# Patient Record
Sex: Female | Born: 1945
Health system: Southern US, Community
[De-identification: ages and names within clinical notes are randomized; demographics above are authoritative.]

## PROBLEM LIST (undated history)

## (undated) DIAGNOSIS — E119 Type 2 diabetes mellitus without complications: Secondary | ICD-10-CM

## (undated) DIAGNOSIS — I10 Essential (primary) hypertension: Secondary | ICD-10-CM

---

## 1997-12-10 ENCOUNTER — Ambulatory Visit (HOSPITAL_COMMUNITY): Admission: RE | Admit: 1997-12-10 | Discharge: 1997-12-10 | Payer: Self-pay | Admitting: Emergency Medicine

## 2000-01-21 ENCOUNTER — Encounter: Admission: RE | Admit: 2000-01-21 | Discharge: 2000-01-21 | Payer: Self-pay | Admitting: Emergency Medicine

## 2000-01-21 ENCOUNTER — Encounter: Payer: Self-pay | Admitting: Emergency Medicine

## 2001-01-22 ENCOUNTER — Encounter: Admission: RE | Admit: 2001-01-22 | Discharge: 2001-01-22 | Payer: Self-pay | Admitting: Emergency Medicine

## 2001-01-22 ENCOUNTER — Encounter: Payer: Self-pay | Admitting: Emergency Medicine

## 2002-01-23 ENCOUNTER — Encounter: Payer: Self-pay | Admitting: Emergency Medicine

## 2002-01-23 ENCOUNTER — Encounter: Admission: RE | Admit: 2002-01-23 | Discharge: 2002-01-23 | Payer: Self-pay | Admitting: Emergency Medicine

## 2003-01-30 ENCOUNTER — Encounter: Admission: RE | Admit: 2003-01-30 | Discharge: 2003-01-30 | Payer: Self-pay | Admitting: Emergency Medicine

## 2003-01-30 ENCOUNTER — Encounter: Payer: Self-pay | Admitting: Emergency Medicine

## 2003-07-15 ENCOUNTER — Encounter: Admission: RE | Admit: 2003-07-15 | Discharge: 2003-07-15 | Payer: Self-pay | Admitting: Emergency Medicine

## 2003-07-15 ENCOUNTER — Encounter: Payer: Self-pay | Admitting: Emergency Medicine

## 2003-12-08 ENCOUNTER — Other Ambulatory Visit: Admission: RE | Admit: 2003-12-08 | Discharge: 2003-12-08 | Payer: Self-pay | Admitting: Emergency Medicine

## 2004-02-03 ENCOUNTER — Encounter: Admission: RE | Admit: 2004-02-03 | Discharge: 2004-02-03 | Payer: Self-pay | Admitting: Emergency Medicine

## 2005-01-24 ENCOUNTER — Other Ambulatory Visit: Admission: RE | Admit: 2005-01-24 | Discharge: 2005-01-24 | Payer: Self-pay | Admitting: Emergency Medicine

## 2005-02-03 ENCOUNTER — Encounter: Admission: RE | Admit: 2005-02-03 | Discharge: 2005-02-03 | Payer: Self-pay | Admitting: Emergency Medicine

## 2005-02-07 ENCOUNTER — Encounter: Admission: RE | Admit: 2005-02-07 | Discharge: 2005-05-08 | Payer: Self-pay | Admitting: Emergency Medicine

## 2006-02-06 ENCOUNTER — Encounter: Admission: RE | Admit: 2006-02-06 | Discharge: 2006-02-06 | Payer: Self-pay | Admitting: Emergency Medicine

## 2007-02-19 ENCOUNTER — Other Ambulatory Visit: Admission: RE | Admit: 2007-02-19 | Discharge: 2007-02-19 | Payer: Self-pay | Admitting: Emergency Medicine

## 2007-03-13 ENCOUNTER — Encounter: Admission: RE | Admit: 2007-03-13 | Discharge: 2007-03-13 | Payer: Self-pay | Admitting: Emergency Medicine

## 2008-02-22 ENCOUNTER — Other Ambulatory Visit: Admission: RE | Admit: 2008-02-22 | Discharge: 2008-02-22 | Payer: Self-pay | Admitting: Emergency Medicine

## 2008-03-13 ENCOUNTER — Encounter: Admission: RE | Admit: 2008-03-13 | Discharge: 2008-03-13 | Payer: Self-pay | Admitting: Emergency Medicine

## 2009-03-16 ENCOUNTER — Encounter: Admission: RE | Admit: 2009-03-16 | Discharge: 2009-03-16 | Payer: Self-pay | Admitting: Emergency Medicine

## 2009-03-17 ENCOUNTER — Other Ambulatory Visit: Admission: RE | Admit: 2009-03-17 | Discharge: 2009-03-17 | Payer: Self-pay | Admitting: Emergency Medicine

## 2010-03-17 ENCOUNTER — Encounter: Admission: RE | Admit: 2010-03-17 | Discharge: 2010-03-17 | Payer: Self-pay | Admitting: Internal Medicine

## 2010-09-24 ENCOUNTER — Other Ambulatory Visit
Admission: RE | Admit: 2010-09-24 | Discharge: 2010-09-24 | Payer: Self-pay | Source: Home / Self Care | Admitting: Geriatric Medicine

## 2011-03-15 ENCOUNTER — Other Ambulatory Visit: Payer: Self-pay | Admitting: Internal Medicine

## 2011-03-15 DIAGNOSIS — Z1231 Encounter for screening mammogram for malignant neoplasm of breast: Secondary | ICD-10-CM

## 2011-03-22 ENCOUNTER — Ambulatory Visit
Admission: RE | Admit: 2011-03-22 | Discharge: 2011-03-22 | Disposition: A | Discharge status: Home / Self Care | Payer: BC Managed Care – PPO | Source: Ambulatory Visit | Attending: Internal Medicine | Admitting: Internal Medicine

## 2011-03-22 DIAGNOSIS — Z1231 Encounter for screening mammogram for malignant neoplasm of breast: Secondary | ICD-10-CM

## 2011-10-04 ENCOUNTER — Other Ambulatory Visit (HOSPITAL_COMMUNITY)
Admission: RE | Admit: 2011-10-04 | Discharge: 2011-10-04 | Disposition: A | Discharge status: Home / Self Care | Payer: BC Managed Care – PPO | Source: Ambulatory Visit | Attending: Internal Medicine | Admitting: Internal Medicine

## 2011-10-04 ENCOUNTER — Other Ambulatory Visit: Payer: Self-pay | Admitting: Internal Medicine

## 2011-10-04 DIAGNOSIS — Z01419 Encounter for gynecological examination (general) (routine) without abnormal findings: Secondary | ICD-10-CM | POA: Insufficient documentation

## 2012-02-28 ENCOUNTER — Other Ambulatory Visit: Payer: Self-pay | Admitting: Internal Medicine

## 2012-02-28 DIAGNOSIS — Z1231 Encounter for screening mammogram for malignant neoplasm of breast: Secondary | ICD-10-CM

## 2012-03-23 ENCOUNTER — Ambulatory Visit
Admission: RE | Admit: 2012-03-23 | Discharge: 2012-03-23 | Disposition: A | Discharge status: Home / Self Care | Payer: BC Managed Care – PPO | Source: Ambulatory Visit | Attending: Internal Medicine | Admitting: Internal Medicine

## 2012-03-23 DIAGNOSIS — Z1231 Encounter for screening mammogram for malignant neoplasm of breast: Secondary | ICD-10-CM

## 2012-10-09 ENCOUNTER — Other Ambulatory Visit: Payer: Self-pay | Admitting: Internal Medicine

## 2012-10-09 ENCOUNTER — Other Ambulatory Visit (HOSPITAL_COMMUNITY)
Admission: RE | Admit: 2012-10-09 | Discharge: 2012-10-09 | Disposition: A | Discharge status: Home / Self Care | Payer: BC Managed Care – PPO | Source: Ambulatory Visit | Attending: Internal Medicine | Admitting: Internal Medicine

## 2012-10-09 DIAGNOSIS — Z01419 Encounter for gynecological examination (general) (routine) without abnormal findings: Secondary | ICD-10-CM | POA: Insufficient documentation

## 2013-03-29 ENCOUNTER — Other Ambulatory Visit: Payer: Self-pay

## 2013-03-29 DIAGNOSIS — Z1231 Encounter for screening mammogram for malignant neoplasm of breast: Secondary | ICD-10-CM

## 2013-05-01 ENCOUNTER — Ambulatory Visit
Admission: RE | Admit: 2013-05-01 | Discharge: 2013-05-01 | Disposition: A | Discharge status: Home / Self Care | Payer: Medicare Other | Source: Ambulatory Visit

## 2013-05-01 DIAGNOSIS — Z1231 Encounter for screening mammogram for malignant neoplasm of breast: Secondary | ICD-10-CM

## 2014-04-01 ENCOUNTER — Other Ambulatory Visit: Payer: Self-pay

## 2014-04-01 DIAGNOSIS — Z1231 Encounter for screening mammogram for malignant neoplasm of breast: Secondary | ICD-10-CM

## 2014-05-02 ENCOUNTER — Ambulatory Visit
Admission: RE | Admit: 2014-05-02 | Discharge: 2014-05-02 | Disposition: A | Discharge status: Home / Self Care | Payer: Commercial Managed Care - HMO | Source: Ambulatory Visit

## 2014-05-02 DIAGNOSIS — Z1231 Encounter for screening mammogram for malignant neoplasm of breast: Secondary | ICD-10-CM

## 2014-10-30 DIAGNOSIS — I10 Essential (primary) hypertension: Secondary | ICD-10-CM | POA: Diagnosis not present

## 2014-10-30 DIAGNOSIS — Z1389 Encounter for screening for other disorder: Secondary | ICD-10-CM | POA: Diagnosis not present

## 2014-10-30 DIAGNOSIS — E559 Vitamin D deficiency, unspecified: Secondary | ICD-10-CM | POA: Diagnosis not present

## 2014-10-30 DIAGNOSIS — E139 Other specified diabetes mellitus without complications: Secondary | ICD-10-CM | POA: Diagnosis not present

## 2014-10-30 DIAGNOSIS — N760 Acute vaginitis: Secondary | ICD-10-CM | POA: Diagnosis not present

## 2014-10-30 DIAGNOSIS — F329 Major depressive disorder, single episode, unspecified: Secondary | ICD-10-CM | POA: Diagnosis not present

## 2014-10-30 DIAGNOSIS — E780 Pure hypercholesterolemia: Secondary | ICD-10-CM | POA: Diagnosis not present

## 2014-10-30 DIAGNOSIS — Z0000 Encounter for general adult medical examination without abnormal findings: Secondary | ICD-10-CM | POA: Diagnosis not present

## 2014-11-05 DIAGNOSIS — N390 Urinary tract infection, site not specified: Secondary | ICD-10-CM | POA: Diagnosis not present

## 2014-11-05 DIAGNOSIS — R300 Dysuria: Secondary | ICD-10-CM | POA: Diagnosis not present

## 2014-11-12 DIAGNOSIS — Z0000 Encounter for general adult medical examination without abnormal findings: Secondary | ICD-10-CM | POA: Diagnosis not present

## 2014-11-28 DIAGNOSIS — K625 Hemorrhage of anus and rectum: Secondary | ICD-10-CM | POA: Diagnosis not present

## 2015-04-13 ENCOUNTER — Other Ambulatory Visit: Payer: Self-pay

## 2015-04-13 DIAGNOSIS — Z1231 Encounter for screening mammogram for malignant neoplasm of breast: Secondary | ICD-10-CM

## 2015-04-30 DIAGNOSIS — Z6834 Body mass index (BMI) 34.0-34.9, adult: Secondary | ICD-10-CM | POA: Diagnosis not present

## 2015-04-30 DIAGNOSIS — E139 Other specified diabetes mellitus without complications: Secondary | ICD-10-CM | POA: Diagnosis not present

## 2015-04-30 DIAGNOSIS — I10 Essential (primary) hypertension: Secondary | ICD-10-CM | POA: Diagnosis not present

## 2015-04-30 DIAGNOSIS — E663 Overweight: Secondary | ICD-10-CM | POA: Diagnosis not present

## 2015-04-30 DIAGNOSIS — F329 Major depressive disorder, single episode, unspecified: Secondary | ICD-10-CM | POA: Diagnosis not present

## 2015-04-30 DIAGNOSIS — E780 Pure hypercholesterolemia: Secondary | ICD-10-CM | POA: Diagnosis not present

## 2015-05-04 ENCOUNTER — Ambulatory Visit
Admission: RE | Admit: 2015-05-04 | Discharge: 2015-05-04 | Disposition: A | Discharge status: Home / Self Care | Payer: Commercial Managed Care - HMO | Source: Ambulatory Visit

## 2015-05-04 DIAGNOSIS — Z1231 Encounter for screening mammogram for malignant neoplasm of breast: Secondary | ICD-10-CM | POA: Diagnosis not present

## 2015-05-25 DIAGNOSIS — H5200 Hypermetropia, unspecified eye: Secondary | ICD-10-CM | POA: Diagnosis not present

## 2015-05-25 DIAGNOSIS — E784 Other hyperlipidemia: Secondary | ICD-10-CM | POA: Diagnosis not present

## 2015-05-25 DIAGNOSIS — I10 Essential (primary) hypertension: Secondary | ICD-10-CM | POA: Diagnosis not present

## 2015-05-25 DIAGNOSIS — E109 Type 1 diabetes mellitus without complications: Secondary | ICD-10-CM | POA: Diagnosis not present

## 2015-05-25 DIAGNOSIS — E119 Type 2 diabetes mellitus without complications: Secondary | ICD-10-CM | POA: Diagnosis not present

## 2015-05-25 DIAGNOSIS — H5210 Myopia, unspecified eye: Secondary | ICD-10-CM | POA: Diagnosis not present

## 2015-11-19 DIAGNOSIS — E785 Hyperlipidemia, unspecified: Secondary | ICD-10-CM | POA: Diagnosis not present

## 2015-11-19 DIAGNOSIS — Z1389 Encounter for screening for other disorder: Secondary | ICD-10-CM | POA: Diagnosis not present

## 2015-11-19 DIAGNOSIS — E119 Type 2 diabetes mellitus without complications: Secondary | ICD-10-CM | POA: Diagnosis not present

## 2015-11-19 DIAGNOSIS — Z7984 Long term (current) use of oral hypoglycemic drugs: Secondary | ICD-10-CM | POA: Diagnosis not present

## 2015-11-19 DIAGNOSIS — Z0000 Encounter for general adult medical examination without abnormal findings: Secondary | ICD-10-CM | POA: Diagnosis not present

## 2015-11-19 DIAGNOSIS — E663 Overweight: Secondary | ICD-10-CM | POA: Diagnosis not present

## 2015-11-19 DIAGNOSIS — F329 Major depressive disorder, single episode, unspecified: Secondary | ICD-10-CM | POA: Diagnosis not present

## 2015-11-19 DIAGNOSIS — Z6833 Body mass index (BMI) 33.0-33.9, adult: Secondary | ICD-10-CM | POA: Diagnosis not present

## 2015-11-19 DIAGNOSIS — I10 Essential (primary) hypertension: Secondary | ICD-10-CM | POA: Diagnosis not present

## 2016-05-12 ENCOUNTER — Other Ambulatory Visit: Payer: Self-pay | Admitting: Internal Medicine

## 2016-05-12 DIAGNOSIS — Z1231 Encounter for screening mammogram for malignant neoplasm of breast: Secondary | ICD-10-CM

## 2016-05-18 ENCOUNTER — Ambulatory Visit
Admission: RE | Admit: 2016-05-18 | Discharge: 2016-05-18 | Disposition: A | Discharge status: Home / Self Care | Payer: Commercial Managed Care - HMO | Source: Ambulatory Visit | Attending: Internal Medicine | Admitting: Internal Medicine

## 2016-05-18 DIAGNOSIS — I10 Essential (primary) hypertension: Secondary | ICD-10-CM | POA: Diagnosis not present

## 2016-05-18 DIAGNOSIS — Z1231 Encounter for screening mammogram for malignant neoplasm of breast: Secondary | ICD-10-CM

## 2016-05-18 DIAGNOSIS — E139 Other specified diabetes mellitus without complications: Secondary | ICD-10-CM | POA: Diagnosis not present

## 2016-05-18 DIAGNOSIS — Z6834 Body mass index (BMI) 34.0-34.9, adult: Secondary | ICD-10-CM | POA: Diagnosis not present

## 2016-05-18 DIAGNOSIS — E663 Overweight: Secondary | ICD-10-CM | POA: Diagnosis not present

## 2016-05-18 DIAGNOSIS — E7800 Pure hypercholesterolemia, unspecified: Secondary | ICD-10-CM | POA: Diagnosis not present

## 2016-05-19 ENCOUNTER — Ambulatory Visit: Payer: Commercial Managed Care - HMO

## 2016-05-25 DIAGNOSIS — E109 Type 1 diabetes mellitus without complications: Secondary | ICD-10-CM | POA: Diagnosis not present

## 2016-05-25 DIAGNOSIS — E7800 Pure hypercholesterolemia, unspecified: Secondary | ICD-10-CM | POA: Diagnosis not present

## 2016-05-25 DIAGNOSIS — I10 Essential (primary) hypertension: Secondary | ICD-10-CM | POA: Diagnosis not present

## 2016-05-25 DIAGNOSIS — Z0100 Encounter for examination of eyes and vision without abnormal findings: Secondary | ICD-10-CM | POA: Diagnosis not present

## 2016-12-01 ENCOUNTER — Other Ambulatory Visit (HOSPITAL_COMMUNITY)
Admission: RE | Admit: 2016-12-01 | Discharge: 2016-12-01 | Disposition: A | Discharge status: Home / Self Care | Payer: Medicare HMO | Source: Ambulatory Visit | Attending: Internal Medicine | Admitting: Internal Medicine

## 2016-12-01 ENCOUNTER — Other Ambulatory Visit: Payer: Self-pay | Admitting: Internal Medicine

## 2016-12-01 DIAGNOSIS — E139 Other specified diabetes mellitus without complications: Secondary | ICD-10-CM | POA: Diagnosis not present

## 2016-12-01 DIAGNOSIS — I10 Essential (primary) hypertension: Secondary | ICD-10-CM | POA: Diagnosis not present

## 2016-12-01 DIAGNOSIS — Z1151 Encounter for screening for human papillomavirus (HPV): Secondary | ICD-10-CM | POA: Insufficient documentation

## 2016-12-01 DIAGNOSIS — Z124 Encounter for screening for malignant neoplasm of cervix: Secondary | ICD-10-CM | POA: Insufficient documentation

## 2016-12-01 DIAGNOSIS — F324 Major depressive disorder, single episode, in partial remission: Secondary | ICD-10-CM | POA: Diagnosis not present

## 2016-12-01 DIAGNOSIS — Z6832 Body mass index (BMI) 32.0-32.9, adult: Secondary | ICD-10-CM | POA: Diagnosis not present

## 2016-12-01 DIAGNOSIS — E7800 Pure hypercholesterolemia, unspecified: Secondary | ICD-10-CM | POA: Diagnosis not present

## 2016-12-01 DIAGNOSIS — Z1389 Encounter for screening for other disorder: Secondary | ICD-10-CM | POA: Diagnosis not present

## 2016-12-01 DIAGNOSIS — Z0000 Encounter for general adult medical examination without abnormal findings: Secondary | ICD-10-CM | POA: Diagnosis not present

## 2016-12-01 DIAGNOSIS — E663 Overweight: Secondary | ICD-10-CM | POA: Diagnosis not present

## 2016-12-01 DIAGNOSIS — N9089 Other specified noninflammatory disorders of vulva and perineum: Secondary | ICD-10-CM | POA: Diagnosis not present

## 2016-12-06 LAB — CYTOLOGY - PAP
Diagnosis: NEGATIVE
HPV (WINDOPATH): NOT DETECTED

## 2017-01-06 DIAGNOSIS — N7689 Other specified inflammation of vagina and vulva: Secondary | ICD-10-CM | POA: Diagnosis not present

## 2017-01-06 DIAGNOSIS — Z7982 Long term (current) use of aspirin: Secondary | ICD-10-CM | POA: Diagnosis not present

## 2017-02-03 ENCOUNTER — Other Ambulatory Visit: Payer: Self-pay | Admitting: Obstetrics & Gynecology

## 2017-02-03 DIAGNOSIS — N7689 Other specified inflammation of vagina and vulva: Secondary | ICD-10-CM | POA: Diagnosis not present

## 2017-02-03 DIAGNOSIS — L900 Lichen sclerosus et atrophicus: Secondary | ICD-10-CM | POA: Diagnosis not present

## 2017-02-03 DIAGNOSIS — N904 Leukoplakia of vulva: Secondary | ICD-10-CM | POA: Diagnosis not present

## 2017-03-29 DIAGNOSIS — K5730 Diverticulosis of large intestine without perforation or abscess without bleeding: Secondary | ICD-10-CM | POA: Diagnosis not present

## 2017-03-29 DIAGNOSIS — Z1211 Encounter for screening for malignant neoplasm of colon: Secondary | ICD-10-CM | POA: Diagnosis not present

## 2017-04-13 ENCOUNTER — Other Ambulatory Visit: Payer: Self-pay | Admitting: Internal Medicine

## 2017-04-13 DIAGNOSIS — Z1231 Encounter for screening mammogram for malignant neoplasm of breast: Secondary | ICD-10-CM

## 2017-05-04 DIAGNOSIS — L900 Lichen sclerosus et atrophicus: Secondary | ICD-10-CM | POA: Diagnosis not present

## 2017-05-29 ENCOUNTER — Ambulatory Visit
Admission: RE | Admit: 2017-05-29 | Discharge: 2017-05-29 | Disposition: A | Discharge status: Home / Self Care | Payer: Medicare HMO | Source: Ambulatory Visit | Attending: Internal Medicine | Admitting: Internal Medicine

## 2017-05-29 DIAGNOSIS — Z1231 Encounter for screening mammogram for malignant neoplasm of breast: Secondary | ICD-10-CM

## 2017-05-31 DIAGNOSIS — I10 Essential (primary) hypertension: Secondary | ICD-10-CM | POA: Diagnosis not present

## 2017-05-31 DIAGNOSIS — E119 Type 2 diabetes mellitus without complications: Secondary | ICD-10-CM | POA: Diagnosis not present

## 2017-05-31 DIAGNOSIS — H35033 Hypertensive retinopathy, bilateral: Secondary | ICD-10-CM | POA: Diagnosis not present

## 2017-05-31 DIAGNOSIS — Z7984 Long term (current) use of oral hypoglycemic drugs: Secondary | ICD-10-CM | POA: Diagnosis not present

## 2017-06-01 DIAGNOSIS — Z6833 Body mass index (BMI) 33.0-33.9, adult: Secondary | ICD-10-CM | POA: Diagnosis not present

## 2017-06-01 DIAGNOSIS — E663 Overweight: Secondary | ICD-10-CM | POA: Diagnosis not present

## 2017-06-01 DIAGNOSIS — E119 Type 2 diabetes mellitus without complications: Secondary | ICD-10-CM | POA: Diagnosis not present

## 2017-06-01 DIAGNOSIS — E7800 Pure hypercholesterolemia, unspecified: Secondary | ICD-10-CM | POA: Diagnosis not present

## 2017-06-01 DIAGNOSIS — F324 Major depressive disorder, single episode, in partial remission: Secondary | ICD-10-CM | POA: Diagnosis not present

## 2017-06-01 DIAGNOSIS — I10 Essential (primary) hypertension: Secondary | ICD-10-CM | POA: Diagnosis not present

## 2017-08-16 DIAGNOSIS — Z23 Encounter for immunization: Secondary | ICD-10-CM | POA: Diagnosis not present

## 2017-12-12 DIAGNOSIS — Z7984 Long term (current) use of oral hypoglycemic drugs: Secondary | ICD-10-CM | POA: Diagnosis not present

## 2017-12-12 DIAGNOSIS — F324 Major depressive disorder, single episode, in partial remission: Secondary | ICD-10-CM | POA: Diagnosis not present

## 2017-12-12 DIAGNOSIS — E119 Type 2 diabetes mellitus without complications: Secondary | ICD-10-CM | POA: Diagnosis not present

## 2017-12-12 DIAGNOSIS — E7800 Pure hypercholesterolemia, unspecified: Secondary | ICD-10-CM | POA: Diagnosis not present

## 2017-12-12 DIAGNOSIS — E663 Overweight: Secondary | ICD-10-CM | POA: Diagnosis not present

## 2017-12-12 DIAGNOSIS — Z1389 Encounter for screening for other disorder: Secondary | ICD-10-CM | POA: Diagnosis not present

## 2017-12-12 DIAGNOSIS — I10 Essential (primary) hypertension: Secondary | ICD-10-CM | POA: Diagnosis not present

## 2017-12-12 DIAGNOSIS — Z0000 Encounter for general adult medical examination without abnormal findings: Secondary | ICD-10-CM | POA: Diagnosis not present

## 2018-01-31 ENCOUNTER — Other Ambulatory Visit: Payer: Self-pay

## 2018-01-31 ENCOUNTER — Emergency Department
Admission: EM | Admit: 2018-01-31 | Discharge: 2018-02-01 | Disposition: A | Discharge status: Home / Self Care | Payer: Medicare HMO | Attending: Emergency Medicine | Admitting: Emergency Medicine

## 2018-01-31 ENCOUNTER — Encounter: Payer: Self-pay | Admitting: Emergency Medicine

## 2018-01-31 DIAGNOSIS — R0789 Other chest pain: Secondary | ICD-10-CM | POA: Diagnosis not present

## 2018-01-31 DIAGNOSIS — R791 Abnormal coagulation profile: Secondary | ICD-10-CM | POA: Diagnosis not present

## 2018-01-31 DIAGNOSIS — I10 Essential (primary) hypertension: Secondary | ICD-10-CM | POA: Insufficient documentation

## 2018-01-31 DIAGNOSIS — M549 Dorsalgia, unspecified: Secondary | ICD-10-CM | POA: Diagnosis not present

## 2018-01-31 DIAGNOSIS — R079 Chest pain, unspecified: Secondary | ICD-10-CM | POA: Diagnosis not present

## 2018-01-31 DIAGNOSIS — R1010 Upper abdominal pain, unspecified: Secondary | ICD-10-CM | POA: Insufficient documentation

## 2018-01-31 DIAGNOSIS — F419 Anxiety disorder, unspecified: Secondary | ICD-10-CM | POA: Insufficient documentation

## 2018-01-31 DIAGNOSIS — E119 Type 2 diabetes mellitus without complications: Secondary | ICD-10-CM | POA: Insufficient documentation

## 2018-01-31 HISTORY — DX: Essential (primary) hypertension: I10

## 2018-01-31 HISTORY — DX: Type 2 diabetes mellitus without complications: E11.9

## 2018-01-31 NOTE — ED Provider Notes (Signed)
[facility] Emergency Department Provider Note  ____________________________________________   First MD Initiated Contact with Patient 01/31/18 2353     (approximate)  I have reviewed the triage vital signs and the nursing notes.   HISTORY  Chief Complaint Abnormal Lab    HPI Jill Gentry is a 72 y.o. female who sent to the emergency department by her primary care physician for an abnormal d-dimer.  Patient has had sharp upper chest pain wrapping around towards her back for the past 24-48 hours.  She initially thought it could be gas and tried over-the-counter medications with no improvement.  She has no history of DVT or pulmonary embolism.  No leg swelling.  No recent surgery travel or immobilization.  She denies history of coronary artery disease.  She does not see a cardiologist.  Her symptoms are now mild to moderate.  Nothing seems to make them better or worse.  Past Medical History:  Diagnosis Date  . Diabetes mellitus without complication (HCC)   . Hypertension     There are no active problems to display for this patient.   History reviewed. No pertinent surgical history.  Prior to Admission medications   Not on File    Allergies Patient has no known allergies.  No family history on file.  Social History Social History   Tobacco Use  . Smoking status: Never Smoker  Substance Use Topics  . Alcohol use: Not on file  . Drug use: Not on file    Review of Systems Constitutional: No fever/chills Eyes: No visual changes. ENT: No sore throat. Cardiovascular: Positive for chest pain. Respiratory: Denies shortness of breath. Gastrointestinal: No abdominal pain.  No nausea, no vomiting.  No diarrhea.  No constipation. Genitourinary: Negative for dysuria. Musculoskeletal: Negative for back pain. Skin: Negative for rash. Neurological: Negative for headaches, focal weakness or  numbness.   ____________________________________________   PHYSICAL EXAM:  VITAL SIGNS: ED Triage Vitals [01/31/18 2343]  Enc Vitals Group     BP      Pulse      Resp      Temp      Temp src      SpO2      Weight 180 lb (81.6 kg)     Height 5\' 1"  (1.549 m)     Head Circumference      Peak Flow      Pain Score 5     Pain Loc      Pain Edu?      Excl. in GC?     Constitutional: Alert and oriented x4 somewhat anxious appearing nontoxic no diaphoresis speaks full clear sentences Eyes: PERRL EOMI. Head: Atraumatic. Nose: No congestion/rhinnorhea. Mouth/Throat: No trismus Neck: No stridor.   Cardiovascular: Normal rate, regular rhythm. Grossly normal heart sounds.  Good peripheral circulation. Respiratory: Normal respiratory effort.  No retractions. Lungs CTAB and moving good air Gastrointestinal: Soft nontender Musculoskeletal: No lower extremity edema legs are equal in size Neurologic:  Normal speech and language. No gross focal neurologic deficits are appreciated. Skin:  Skin is warm, dry and intact. No rash noted. Psychiatric: Mood and affect are normal. Speech and behavior are normal.    ____________________________________________   DIFFERENTIAL includes but not limited to  Pancreatitis, gastritis, acute coronary syndrome, pulmonary embolism, pleurisy ____________________________________________   LABS (all labs ordered are listed, but only abnormal results are displayed)  Labs Reviewed  COMPREHENSIVE METABOLIC PANEL - Abnormal; Notable for the following components:  Result Value   Glucose, Bld 181 (*)    ALT 11 (*)    All other components within normal limits  CBC WITH DIFFERENTIAL/PLATELET - Abnormal; Notable for the following components:   Hemoglobin 10.7 (*)    HCT 32.7 (*)    RDW 15.2 (*)    All other components within normal limits  TROPONIN I  BRAIN NATRIURETIC PEPTIDE  LIPASE, BLOOD    Lab work reviewed by me with no acute  disease __________________________________________  EKG  ED ECG REPORT I, Merrily Brittle, the attending physician, personally viewed and interpreted this ECG.  Date: 01/31/2018 EKG Time:  Rate: 76 Rhythm: normal sinus rhythm QRS Axis: normal Intervals: normal ST/T Wave abnormalities: T wave inversion V2 V3 V4 Narrative Interpretation: No ST elevation but possible subendocardial ischemia  ____________________________________________  RADIOLOGY  CT angiogram reviewed by me with no blood clot ____________________________________________   PROCEDURES  Procedure(s) performed: no  Procedures  Critical Care performed: no  Observation: no ____________________________________________   INITIAL IMPRESSION / ASSESSMENT AND PLAN / ED COURSE  Pertinent labs & imaging results that were available during my care of the patient were reviewed by me and considered in my medical decision making (see chart for details).  The patient arrives hemodynamically stable and well-appearing.  EKG with no signs of right heart strain and she has no risk factors for DVT or pulmonary embolism.  The elevated d-dimer with no clear etiology of her chest pain is concerning however since CT angiogram obtained today which fortunately is negative.  Given reassurance and primary care follow-up.  She verbalizes understanding and agreement the plan.      ____________________________________________   FINAL CLINICAL IMPRESSION(S) / ED DIAGNOSES  Final diagnoses:  Atypical chest pain      NEW MEDICATIONS STARTED DURING THIS VISIT:  There are no discharge medications for this patient.    Note:  This document was prepared using Dragon voice recognition software and may include unintentional dictation errors.     Merrily Brittle, MD 02/01/18 [id number]

## 2018-01-31 NOTE — ED Triage Notes (Addendum)
Patient ambulatory to triage with steady gait, without difficulty or distress noted; pt reports seen by PCP today for upper abd pain radiating around back since yesterday; was called PTA to notify her that her d-dimer was elevated; denies any accomp symptoms; ibuprofen taken at 6pm with some relief

## 2018-02-01 ENCOUNTER — Emergency Department: Payer: Medicare HMO

## 2018-02-01 DIAGNOSIS — R079 Chest pain, unspecified: Secondary | ICD-10-CM | POA: Diagnosis not present

## 2018-02-01 LAB — COMPREHENSIVE METABOLIC PANEL
ALBUMIN: 3.7 g/dL (ref 3.5–5.0)
ALT: 11 U/L — ABNORMAL LOW (ref 14–54)
AST: 19 U/L (ref 15–41)
Alkaline Phosphatase: 84 U/L (ref 38–126)
Anion gap: 5 (ref 5–15)
BILIRUBIN TOTAL: 0.5 mg/dL (ref 0.3–1.2)
BUN: 15 mg/dL (ref 6–20)
CHLORIDE: 105 mmol/L (ref 101–111)
CO2: 26 mmol/L (ref 22–32)
Calcium: 9 mg/dL (ref 8.9–10.3)
Creatinine, Ser: 0.87 mg/dL (ref 0.44–1.00)
GFR calc Af Amer: >60 mL/min (ref >60)
GFR calc non Af Amer: >60 mL/min (ref >60)
GLUCOSE: 181 mg/dL — AB (ref 65–99)
POTASSIUM: 3.6 mmol/L (ref 3.5–5.1)
Sodium: 136 mmol/L (ref 135–145)
Total Protein: 6.8 g/dL (ref 6.5–8.1)

## 2018-02-01 LAB — CBC WITH DIFFERENTIAL/PLATELET
Basophils Absolute: 0.1 10*3/uL (ref 0–0.1)
Basophils Relative: 1 %
EOS PCT: 3 %
Eosinophils Absolute: 0.2 10*3/uL (ref 0–0.7)
HCT: 32.7 % — ABNORMAL LOW (ref 35.0–47.0)
Hemoglobin: 10.7 g/dL — ABNORMAL LOW (ref 12.0–16.0)
LYMPHS ABS: 2.2 10*3/uL (ref 1.0–3.6)
LYMPHS PCT: 30 %
MCH: 27.4 pg (ref 26.0–34.0)
MCHC: 32.8 g/dL (ref 32.0–36.0)
MCV: 83.7 fL (ref 80.0–100.0)
MONO ABS: 0.7 10*3/uL (ref 0.2–0.9)
MONOS PCT: 9 %
Neutro Abs: 4.2 10*3/uL (ref 1.4–6.5)
Neutrophils Relative %: 57 %
PLATELETS: 234 10*3/uL (ref 150–440)
RBC: 3.91 MIL/uL (ref 3.80–5.20)
RDW: 15.2 % — AB (ref 11.5–14.5)
WBC: 7.4 10*3/uL (ref 3.6–11.0)

## 2018-02-01 LAB — BRAIN NATRIURETIC PEPTIDE: B Natriuretic Peptide: 21 pg/mL (ref 0.0–100.0)

## 2018-02-01 LAB — TROPONIN I: Troponin I: <0.03 ng/mL (ref <0.03)

## 2018-02-01 LAB — LIPASE, BLOOD: LIPASE: 33 U/L (ref 11–51)

## 2018-02-01 MED ORDER — IOHEXOL 350 MG/ML SOLN
75.0000 mL | Freq: Once | INTRAVENOUS | Status: AC | PRN
  Administered 2018-02-01: 75 mL via INTRAVENOUS

## 2018-02-01 NOTE — ED Notes (Signed)
Sent over from PCP for elevated d-dimer and has complaints of upper abd pain wrapping around to the back that started Tuesday

## 2018-02-01 NOTE — Discharge Instructions (Signed)
Fortunately today your blood work and your CT scan were reassuring.  Please follow-up with your primary care physician within 2 days for reevaluation return to the emergency department sooner for any concerns.  It was a pleasure to take care of you today, and thank you for coming to our emergency department.  If you have any questions or concerns before leaving please ask the nurse to grab me and I'm more than happy to go through your aftercare instructions again.  If you were prescribed any opioid pain medication today such as Norco, Vicodin, Percocet, morphine, hydrocodone, or oxycodone please make sure you do not drive when you are taking this medication as it can alter your ability to drive safely.  If you have any concerns once you are home that you are not improving or are in fact getting worse before you can make it to your follow-up appointment, please do not hesitate to call 911 and come back for further evaluation.  Merrily Brittle, MD  Results for orders placed or performed during the hospital encounter of 01/31/18  Comprehensive metabolic panel  Result Value Ref Range   Sodium 136 135 - 145 mmol/L   Potassium 3.6 3.5 - 5.1 mmol/L   Chloride 105 101 - 111 mmol/L   CO2 26 22 - 32 mmol/L   Glucose, Bld 181 (H) 65 - 99 mg/dL   BUN 15 6 - 20 mg/dL   Creatinine, Ser 0.45 0.44 - 1.00 mg/dL   Calcium 9.0 8.9 - 40.9 mg/dL   Total Protein 6.8 6.5 - 8.1 g/dL   Albumin 3.7 3.5 - 5.0 g/dL   AST 19 15 - 41 U/L   ALT 11 (L) 14 - 54 U/L   Alkaline Phosphatase 84 38 - 126 U/L   Total Bilirubin 0.5 0.3 - 1.2 mg/dL   GFR calc non Af Amer >60 >60 mL/min   GFR calc Af Amer >60 >60 mL/min   Anion gap 5 5 - 15  CBC with Differential  Result Value Ref Range   WBC 7.4 3.6 - 11.0 K/uL   RBC 3.91 3.80 - 5.20 MIL/uL   Hemoglobin 10.7 (L) 12.0 - 16.0 g/dL   HCT 81.1 (L) 91.4 - 78.2 %   MCV 83.7 80.0 - 100.0 fL   MCH 27.4 26.0 - 34.0 pg   MCHC 32.8 32.0 - 36.0 g/dL   RDW 95.6 (H) 21.3 - 08.6 %   Platelets 234 150 - 440 K/uL   Neutrophils Relative % 57 %   Neutro Abs 4.2 1.4 - 6.5 K/uL   Lymphocytes Relative 30 %   Lymphs Abs 2.2 1.0 - 3.6 K/uL   Monocytes Relative 9 %   Monocytes Absolute 0.7 0.2 - 0.9 K/uL   Eosinophils Relative 3 %   Eosinophils Absolute 0.2 0 - 0.7 K/uL   Basophils Relative 1 %   Basophils Absolute 0.1 0 - 0.1 K/uL  Troponin I  Result Value Ref Range   Troponin I <0.03 <0.03 ng/mL  Brain natriuretic peptide  Result Value Ref Range   B Natriuretic Peptide 21.0 0.0 - 100.0 pg/mL  Lipase, blood  Result Value Ref Range   Lipase 33 11 - 51 U/L   Ct Angio Chest Pe W/cm &/or Wo Cm  Result Date: 02/01/2018 CLINICAL DATA:  Chest pain with inspiration. EXAM: CT ANGIOGRAPHY CHEST WITH CONTRAST TECHNIQUE: Multidetector CT imaging of the chest was performed using the standard protocol during bolus administration of intravenous contrast. Multiplanar CT image reconstructions and MIPs  were obtained to evaluate the vascular anatomy. CONTRAST:  75mL OMNIPAQUE IOHEXOL 350 MG/ML SOLN COMPARISON:  Radiograph earlier this day. FINDINGS: Cardiovascular: There are no filling defects within the pulmonary arteries to suggest pulmonary embolus. Thoracic aorta is normal in caliber with mild atherosclerosis. No dissection. Mild cardiomegaly. No pericardial effusion. Mediastinum/Nodes: No enlarged mediastinal or hilar lymph nodes. The esophagus is decompressed. No visualized thyroid nodule. Lungs/Pleura: No consolidation, pulmonary edema or pleural effusion. Mild atelectasis in the lung bases. Trachea and mainstem bronchi are patent. No pulmonary mass. Upper Abdomen: No acute finding. Musculoskeletal: There are no acute or suspicious osseous abnormalities. Degenerative change in the spine. Review of the MIP images confirms the above findings. IMPRESSION: 1. No pulmonary embolus or acute intrathoracic abnormality. 2. Mild cardiomegaly and Aortic Atherosclerosis (ICD10-I70.0). Electronically  Signed   By: Rubye OaksMelanie  Ehinger M.D.   On: 02/01/2018 01:54   Dg Chest Port 1 View  Result Date: 02/01/2018 CLINICAL DATA:  Chest pain. EXAM: PORTABLE CHEST 1 VIEW COMPARISON:  None. FINDINGS: Heart size upper normal.The cardiomediastinal contours are normal. Pulmonary vasculature is normal. No consolidation, pleural effusion, or pneumothorax. No acute osseous abnormalities are seen. IMPRESSION: Upper normal heart size without acute abnormality. Electronically Signed   By: Rubye OaksMelanie  Ehinger M.D.   On: 02/01/2018 00:56

## 2018-04-20 ENCOUNTER — Other Ambulatory Visit: Payer: Self-pay | Admitting: Internal Medicine

## 2018-04-20 DIAGNOSIS — Z1231 Encounter for screening mammogram for malignant neoplasm of breast: Secondary | ICD-10-CM

## 2018-05-31 ENCOUNTER — Ambulatory Visit
Admission: RE | Admit: 2018-05-31 | Discharge: 2018-05-31 | Disposition: A | Discharge status: Home / Self Care | Payer: Medicare HMO | Source: Ambulatory Visit | Attending: Internal Medicine | Admitting: Internal Medicine

## 2018-05-31 DIAGNOSIS — Z1231 Encounter for screening mammogram for malignant neoplasm of breast: Secondary | ICD-10-CM | POA: Diagnosis not present

## 2018-06-12 DIAGNOSIS — F324 Major depressive disorder, single episode, in partial remission: Secondary | ICD-10-CM | POA: Diagnosis not present

## 2018-06-12 DIAGNOSIS — E7800 Pure hypercholesterolemia, unspecified: Secondary | ICD-10-CM | POA: Diagnosis not present

## 2018-06-12 DIAGNOSIS — I700 Atherosclerosis of aorta: Secondary | ICD-10-CM | POA: Diagnosis not present

## 2018-06-12 DIAGNOSIS — I10 Essential (primary) hypertension: Secondary | ICD-10-CM | POA: Diagnosis not present

## 2018-06-12 DIAGNOSIS — E1169 Type 2 diabetes mellitus with other specified complication: Secondary | ICD-10-CM | POA: Diagnosis not present

## 2018-06-12 DIAGNOSIS — D649 Anemia, unspecified: Secondary | ICD-10-CM | POA: Diagnosis not present

## 2018-06-12 DIAGNOSIS — Z7984 Long term (current) use of oral hypoglycemic drugs: Secondary | ICD-10-CM | POA: Diagnosis not present

## 2018-06-14 DIAGNOSIS — Z01419 Encounter for gynecological examination (general) (routine) without abnormal findings: Secondary | ICD-10-CM | POA: Diagnosis not present

## 2018-06-14 DIAGNOSIS — L900 Lichen sclerosus et atrophicus: Secondary | ICD-10-CM | POA: Diagnosis not present

## 2018-08-02 DIAGNOSIS — R300 Dysuria: Secondary | ICD-10-CM | POA: Diagnosis not present

## 2018-11-05 DIAGNOSIS — Z23 Encounter for immunization: Secondary | ICD-10-CM | POA: Diagnosis not present

## 2019-01-14 DIAGNOSIS — Z79899 Other long term (current) drug therapy: Secondary | ICD-10-CM | POA: Diagnosis not present

## 2019-01-14 DIAGNOSIS — I10 Essential (primary) hypertension: Secondary | ICD-10-CM | POA: Diagnosis not present

## 2019-01-14 DIAGNOSIS — E2839 Other primary ovarian failure: Secondary | ICD-10-CM | POA: Diagnosis not present

## 2019-01-14 DIAGNOSIS — E1169 Type 2 diabetes mellitus with other specified complication: Secondary | ICD-10-CM | POA: Diagnosis not present

## 2019-01-14 DIAGNOSIS — Z0000 Encounter for general adult medical examination without abnormal findings: Secondary | ICD-10-CM | POA: Diagnosis not present

## 2019-01-14 DIAGNOSIS — F324 Major depressive disorder, single episode, in partial remission: Secondary | ICD-10-CM | POA: Diagnosis not present

## 2019-01-14 DIAGNOSIS — E663 Overweight: Secondary | ICD-10-CM | POA: Diagnosis not present

## 2019-01-14 DIAGNOSIS — Z1389 Encounter for screening for other disorder: Secondary | ICD-10-CM | POA: Diagnosis not present

## 2019-01-14 DIAGNOSIS — E7800 Pure hypercholesterolemia, unspecified: Secondary | ICD-10-CM | POA: Diagnosis not present

## 2019-02-05 ENCOUNTER — Other Ambulatory Visit: Payer: Self-pay | Admitting: Internal Medicine

## 2019-02-05 DIAGNOSIS — E2839 Other primary ovarian failure: Secondary | ICD-10-CM

## 2019-04-29 ENCOUNTER — Other Ambulatory Visit: Payer: Self-pay | Admitting: Internal Medicine

## 2019-04-29 DIAGNOSIS — Z1231 Encounter for screening mammogram for malignant neoplasm of breast: Secondary | ICD-10-CM

## 2019-05-08 ENCOUNTER — Other Ambulatory Visit: Payer: Self-pay

## 2019-05-08 ENCOUNTER — Ambulatory Visit
Admission: RE | Admit: 2019-05-08 | Discharge: 2019-05-08 | Disposition: A | Discharge status: Home / Self Care | Payer: Medicare HMO | Source: Ambulatory Visit | Attending: Internal Medicine | Admitting: Internal Medicine

## 2019-05-08 DIAGNOSIS — Z780 Asymptomatic menopausal state: Secondary | ICD-10-CM | POA: Diagnosis not present

## 2019-05-08 DIAGNOSIS — M85851 Other specified disorders of bone density and structure, right thigh: Secondary | ICD-10-CM | POA: Diagnosis not present

## 2019-05-08 DIAGNOSIS — E2839 Other primary ovarian failure: Secondary | ICD-10-CM

## 2019-06-12 ENCOUNTER — Other Ambulatory Visit: Payer: Self-pay

## 2019-06-12 ENCOUNTER — Ambulatory Visit
Admission: RE | Admit: 2019-06-12 | Discharge: 2019-06-12 | Disposition: A | Discharge status: Home / Self Care | Payer: Medicare HMO | Source: Ambulatory Visit | Attending: Internal Medicine | Admitting: Internal Medicine

## 2019-06-12 DIAGNOSIS — Z1231 Encounter for screening mammogram for malignant neoplasm of breast: Secondary | ICD-10-CM

## 2019-06-25 DIAGNOSIS — L900 Lichen sclerosus et atrophicus: Secondary | ICD-10-CM | POA: Diagnosis not present

## 2019-07-18 DIAGNOSIS — I10 Essential (primary) hypertension: Secondary | ICD-10-CM | POA: Diagnosis not present

## 2019-07-18 DIAGNOSIS — E663 Overweight: Secondary | ICD-10-CM | POA: Diagnosis not present

## 2019-07-18 DIAGNOSIS — E1169 Type 2 diabetes mellitus with other specified complication: Secondary | ICD-10-CM | POA: Diagnosis not present

## 2019-07-18 DIAGNOSIS — E7800 Pure hypercholesterolemia, unspecified: Secondary | ICD-10-CM | POA: Diagnosis not present

## 2019-07-18 DIAGNOSIS — F324 Major depressive disorder, single episode, in partial remission: Secondary | ICD-10-CM | POA: Diagnosis not present

## 2019-07-18 DIAGNOSIS — E119 Type 2 diabetes mellitus without complications: Secondary | ICD-10-CM | POA: Diagnosis not present

## 2019-07-25 DIAGNOSIS — E109 Type 1 diabetes mellitus without complications: Secondary | ICD-10-CM | POA: Diagnosis not present

## 2019-07-25 DIAGNOSIS — Z135 Encounter for screening for eye and ear disorders: Secondary | ICD-10-CM | POA: Diagnosis not present

## 2019-07-25 DIAGNOSIS — Z0100 Encounter for examination of eyes and vision without abnormal findings: Secondary | ICD-10-CM | POA: Diagnosis not present

## 2019-09-26 ENCOUNTER — Other Ambulatory Visit: Payer: Self-pay

## 2019-09-26 DIAGNOSIS — Z20822 Contact with and (suspected) exposure to covid-19: Secondary | ICD-10-CM

## 2019-09-28 LAB — NOVEL CORONAVIRUS, NAA: SARS-CoV-2, NAA: DETECTED — AB

## 2019-09-29 ENCOUNTER — Encounter: Payer: Self-pay | Admitting: Nurse Practitioner

## 2019-09-29 DIAGNOSIS — E119 Type 2 diabetes mellitus without complications: Secondary | ICD-10-CM | POA: Insufficient documentation

## 2019-09-29 DIAGNOSIS — I10 Essential (primary) hypertension: Secondary | ICD-10-CM | POA: Insufficient documentation

## 2020-02-03 DIAGNOSIS — Z1389 Encounter for screening for other disorder: Secondary | ICD-10-CM | POA: Diagnosis not present

## 2020-02-03 DIAGNOSIS — E1169 Type 2 diabetes mellitus with other specified complication: Secondary | ICD-10-CM | POA: Diagnosis not present

## 2020-02-03 DIAGNOSIS — Z0000 Encounter for general adult medical examination without abnormal findings: Secondary | ICD-10-CM | POA: Diagnosis not present

## 2020-02-03 DIAGNOSIS — R5383 Other fatigue: Secondary | ICD-10-CM | POA: Diagnosis not present

## 2020-02-03 DIAGNOSIS — D649 Anemia, unspecified: Secondary | ICD-10-CM | POA: Diagnosis not present

## 2020-02-03 DIAGNOSIS — E663 Overweight: Secondary | ICD-10-CM | POA: Diagnosis not present

## 2020-02-03 DIAGNOSIS — F324 Major depressive disorder, single episode, in partial remission: Secondary | ICD-10-CM | POA: Diagnosis not present

## 2020-02-03 DIAGNOSIS — I10 Essential (primary) hypertension: Secondary | ICD-10-CM | POA: Diagnosis not present

## 2020-02-03 DIAGNOSIS — E7800 Pure hypercholesterolemia, unspecified: Secondary | ICD-10-CM | POA: Diagnosis not present

## 2020-02-05 DIAGNOSIS — D509 Iron deficiency anemia, unspecified: Secondary | ICD-10-CM | POA: Diagnosis not present

## 2020-02-12 DIAGNOSIS — D509 Iron deficiency anemia, unspecified: Secondary | ICD-10-CM | POA: Diagnosis not present

## 2020-02-18 DIAGNOSIS — D509 Iron deficiency anemia, unspecified: Secondary | ICD-10-CM | POA: Diagnosis not present

## 2020-03-16 DIAGNOSIS — D509 Iron deficiency anemia, unspecified: Secondary | ICD-10-CM | POA: Diagnosis not present

## 2020-03-30 DIAGNOSIS — R310 Gross hematuria: Secondary | ICD-10-CM | POA: Diagnosis not present

## 2020-05-20 ENCOUNTER — Other Ambulatory Visit: Payer: Self-pay | Admitting: Internal Medicine

## 2020-05-20 DIAGNOSIS — Z1231 Encounter for screening mammogram for malignant neoplasm of breast: Secondary | ICD-10-CM

## 2020-06-12 ENCOUNTER — Ambulatory Visit: Payer: Medicare HMO

## 2020-06-25 ENCOUNTER — Ambulatory Visit
Admission: RE | Admit: 2020-06-25 | Discharge: 2020-06-25 | Disposition: A | Discharge status: Home / Self Care | Payer: Medicare HMO | Source: Ambulatory Visit | Attending: Internal Medicine | Admitting: Internal Medicine

## 2020-06-25 ENCOUNTER — Other Ambulatory Visit: Payer: Self-pay

## 2020-06-25 DIAGNOSIS — Z1231 Encounter for screening mammogram for malignant neoplasm of breast: Secondary | ICD-10-CM

## 2020-06-25 DIAGNOSIS — L900 Lichen sclerosus et atrophicus: Secondary | ICD-10-CM | POA: Diagnosis not present

## 2020-08-04 DIAGNOSIS — I10 Essential (primary) hypertension: Secondary | ICD-10-CM | POA: Diagnosis not present

## 2020-08-04 DIAGNOSIS — E7800 Pure hypercholesterolemia, unspecified: Secondary | ICD-10-CM | POA: Diagnosis not present

## 2020-08-04 DIAGNOSIS — E663 Overweight: Secondary | ICD-10-CM | POA: Diagnosis not present

## 2020-08-04 DIAGNOSIS — Z7984 Long term (current) use of oral hypoglycemic drugs: Secondary | ICD-10-CM | POA: Diagnosis not present

## 2020-08-04 DIAGNOSIS — R319 Hematuria, unspecified: Secondary | ICD-10-CM | POA: Diagnosis not present

## 2020-08-04 DIAGNOSIS — F329 Major depressive disorder, single episode, unspecified: Secondary | ICD-10-CM | POA: Diagnosis not present

## 2020-08-04 DIAGNOSIS — D509 Iron deficiency anemia, unspecified: Secondary | ICD-10-CM | POA: Diagnosis not present

## 2020-08-04 DIAGNOSIS — E1169 Type 2 diabetes mellitus with other specified complication: Secondary | ICD-10-CM | POA: Diagnosis not present

## 2020-08-04 DIAGNOSIS — Z23 Encounter for immunization: Secondary | ICD-10-CM | POA: Diagnosis not present

## 2020-08-14 DIAGNOSIS — Z135 Encounter for screening for eye and ear disorders: Secondary | ICD-10-CM | POA: Diagnosis not present

## 2020-08-14 DIAGNOSIS — Z0100 Encounter for examination of eyes and vision without abnormal findings: Secondary | ICD-10-CM | POA: Diagnosis not present

## 2020-08-14 DIAGNOSIS — H524 Presbyopia: Secondary | ICD-10-CM | POA: Diagnosis not present

## 2021-02-18 DIAGNOSIS — Z5181 Encounter for therapeutic drug level monitoring: Secondary | ICD-10-CM | POA: Diagnosis not present

## 2021-02-18 DIAGNOSIS — R300 Dysuria: Secondary | ICD-10-CM | POA: Diagnosis not present

## 2021-02-18 DIAGNOSIS — Z79899 Other long term (current) drug therapy: Secondary | ICD-10-CM | POA: Diagnosis not present

## 2021-02-18 DIAGNOSIS — Z0000 Encounter for general adult medical examination without abnormal findings: Secondary | ICD-10-CM | POA: Diagnosis not present

## 2021-02-18 DIAGNOSIS — M8588 Other specified disorders of bone density and structure, other site: Secondary | ICD-10-CM | POA: Diagnosis not present

## 2021-02-18 DIAGNOSIS — E663 Overweight: Secondary | ICD-10-CM | POA: Diagnosis not present

## 2021-02-18 DIAGNOSIS — F324 Major depressive disorder, single episode, in partial remission: Secondary | ICD-10-CM | POA: Diagnosis not present

## 2021-02-18 DIAGNOSIS — E1169 Type 2 diabetes mellitus with other specified complication: Secondary | ICD-10-CM | POA: Diagnosis not present

## 2021-02-18 DIAGNOSIS — I10 Essential (primary) hypertension: Secondary | ICD-10-CM | POA: Diagnosis not present

## 2021-02-18 DIAGNOSIS — E7800 Pure hypercholesterolemia, unspecified: Secondary | ICD-10-CM | POA: Diagnosis not present

## 2021-02-22 ENCOUNTER — Other Ambulatory Visit: Payer: Self-pay | Admitting: Internal Medicine

## 2021-02-22 DIAGNOSIS — M8580 Other specified disorders of bone density and structure, unspecified site: Secondary | ICD-10-CM

## 2021-03-25 ENCOUNTER — Other Ambulatory Visit: Payer: Medicare HMO

## 2021-05-14 DIAGNOSIS — R350 Frequency of micturition: Secondary | ICD-10-CM | POA: Diagnosis not present

## 2021-05-14 IMAGING — MG DIGITAL SCREENING BILATERAL MAMMOGRAM WITH TOMO AND CAD
8 series · 8 of 24 positions shown · non-contrast
Comparison: Previous exam(s).

CLINICAL DATA: Screening.

EXAM:
DIGITAL SCREENING BILATERAL MAMMOGRAM WITH TOMO AND CAD

[L CC synth-2D]
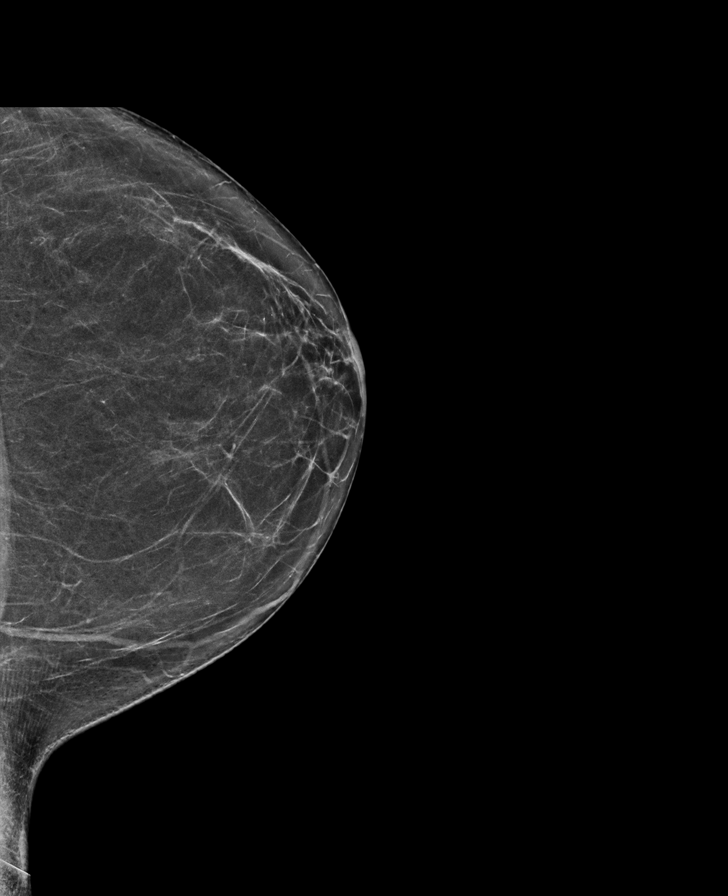

[R MLO synth-2D]
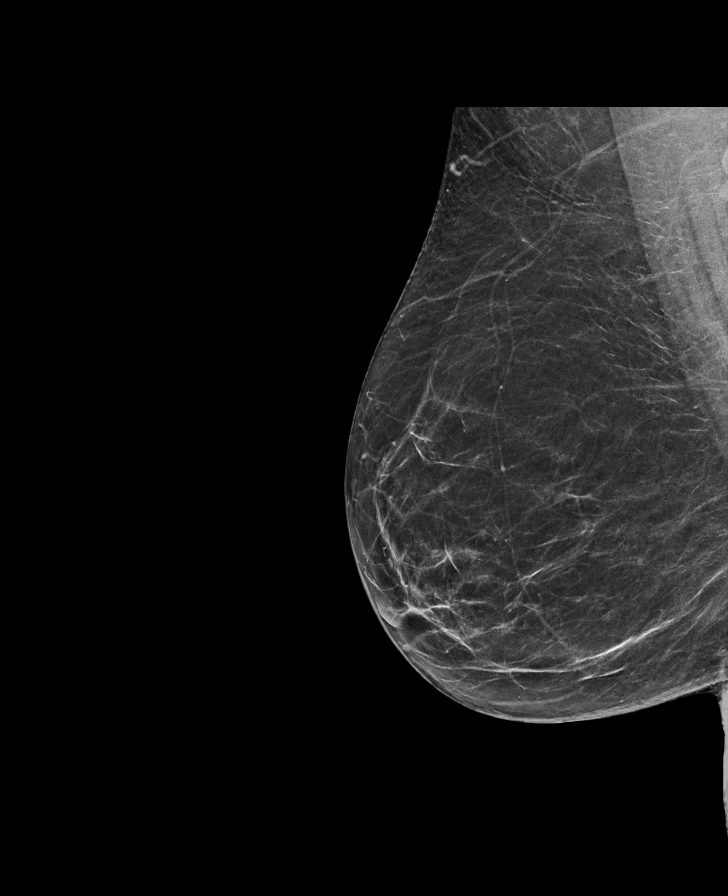

[L MLO synth-2D]
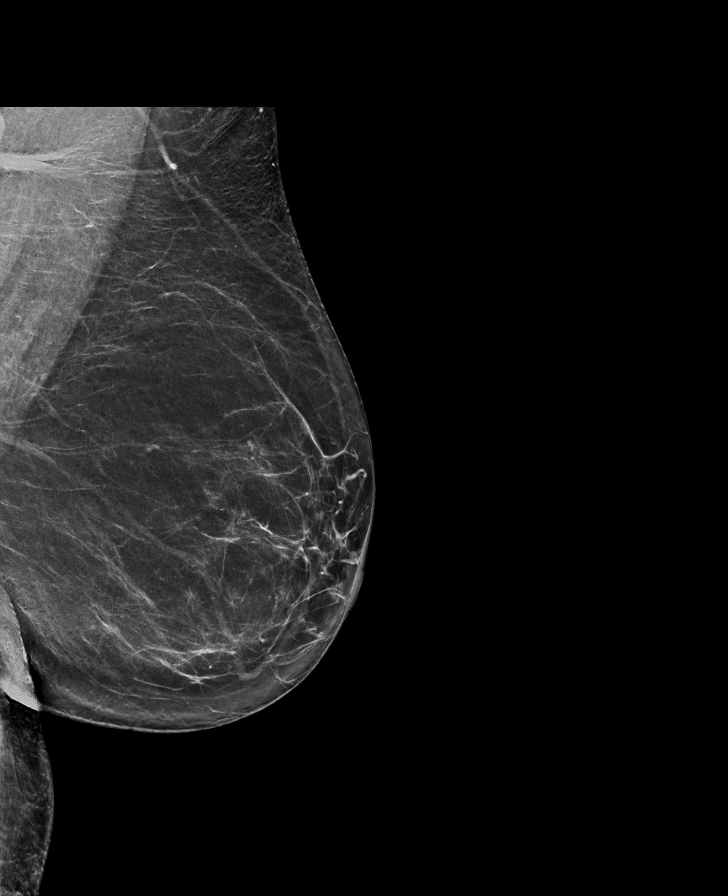

[R CC synth-2D]
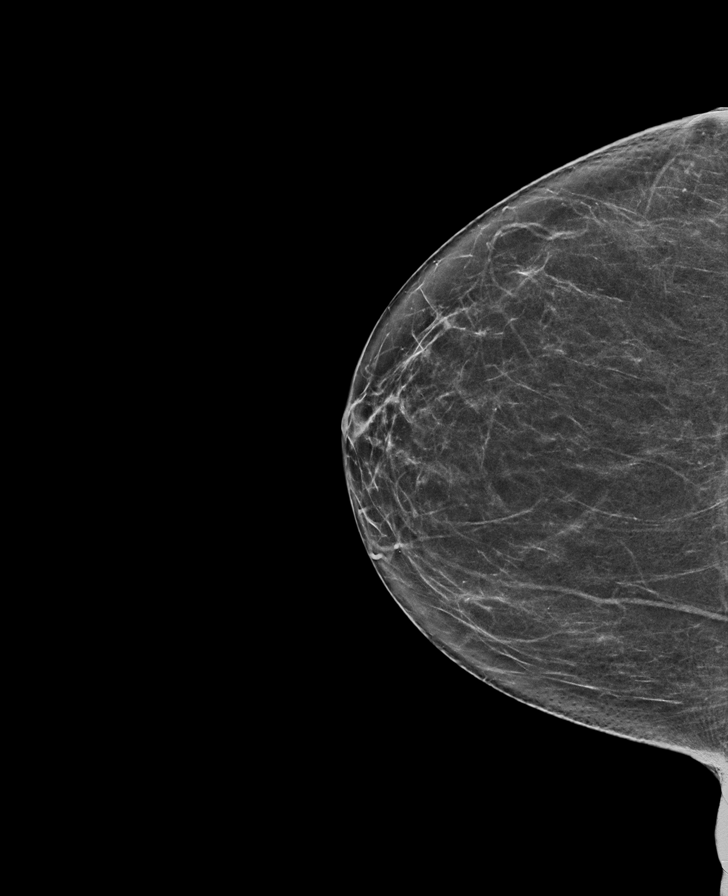

[R MLO tomo · tomo slice 37/72.0]
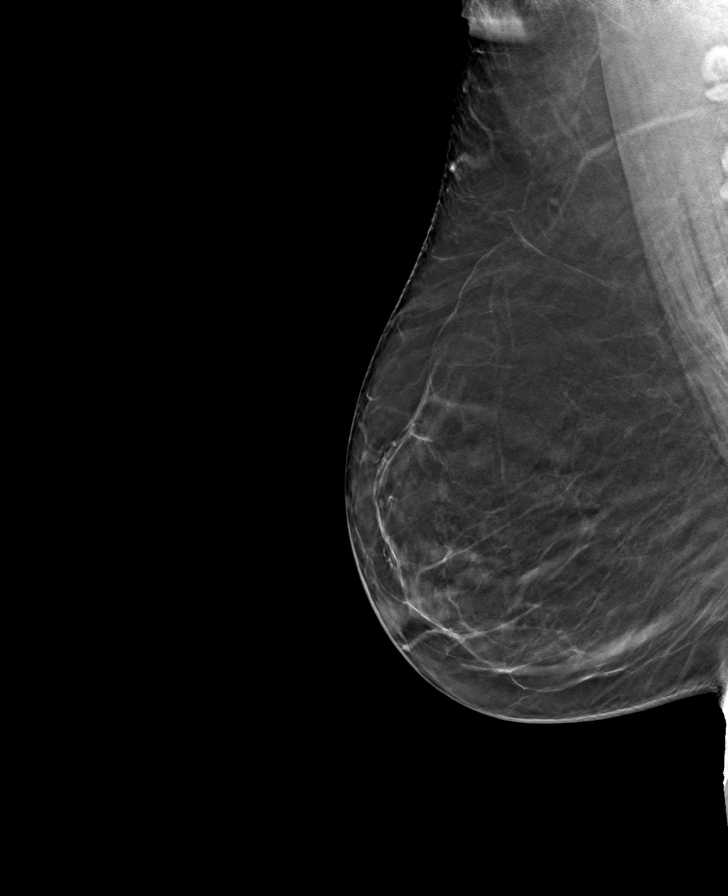

[L MLO tomo · tomo slice 38/75.0]
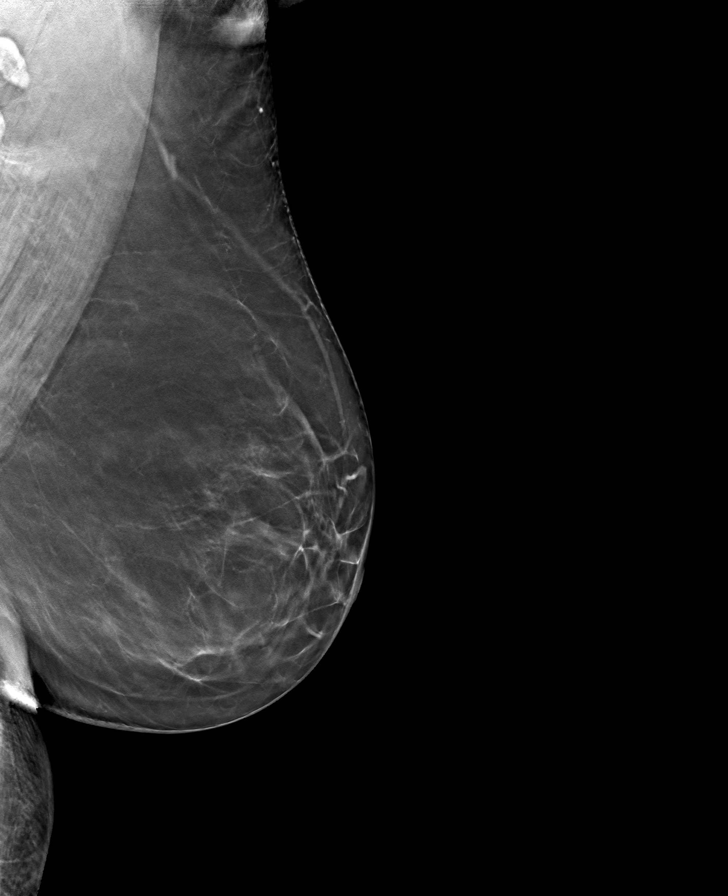

[L CC tomo · tomo slice 35/68.0]
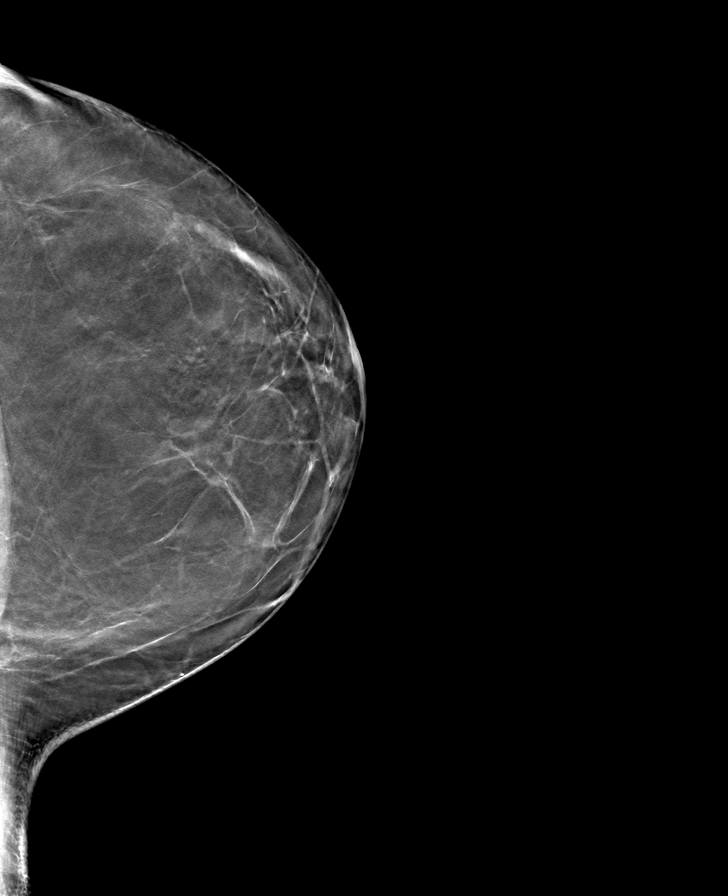

[R CC tomo · tomo slice 33/65.0]
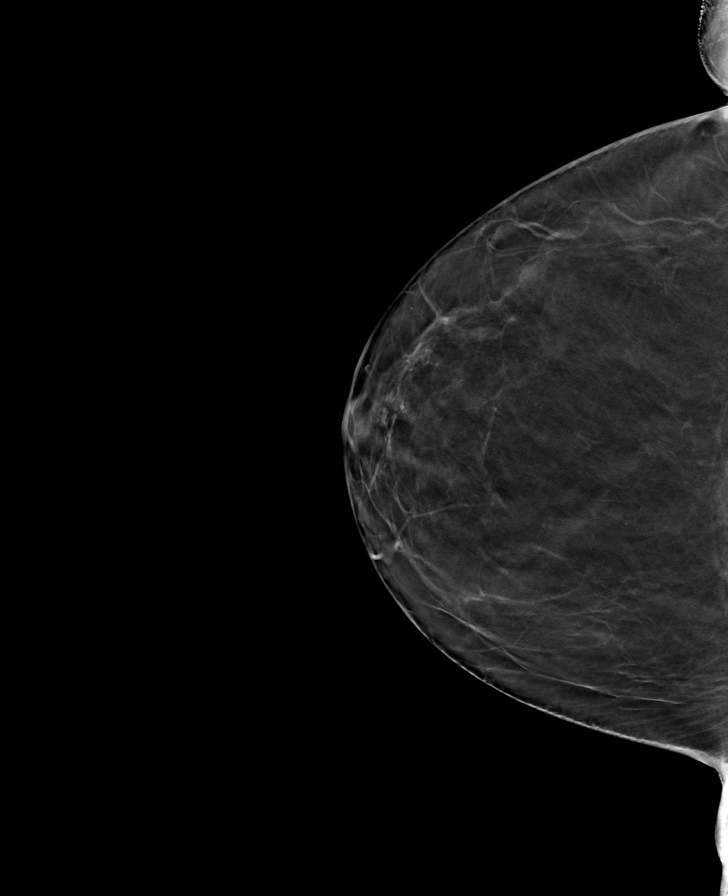

[8 of 24 positions shown; findings below may reference images not displayed]

ACR Breast Density Category b: There are scattered areas of
fibroglandular density.
FINDINGS: There are no findings suspicious for malignancy. Images were
processed with CAD.
IMPRESSION: No mammographic evidence of malignancy. A result letter of this
screening mammogram will be mailed directly to the patient.

RECOMMENDATION:
Screening mammogram in one year. (Code:[id number])

BI-RADS CATEGORY  1: Negative.

## 2021-05-26 ENCOUNTER — Other Ambulatory Visit: Payer: Self-pay | Admitting: Internal Medicine

## 2021-05-26 DIAGNOSIS — Z1231 Encounter for screening mammogram for malignant neoplasm of breast: Secondary | ICD-10-CM

## 2021-07-15 ENCOUNTER — Other Ambulatory Visit: Payer: Self-pay

## 2021-07-15 ENCOUNTER — Ambulatory Visit
Admission: RE | Admit: 2021-07-15 | Discharge: 2021-07-15 | Disposition: A | Discharge status: Home / Self Care | Payer: Medicare HMO | Source: Ambulatory Visit | Attending: Internal Medicine | Admitting: Internal Medicine

## 2021-07-15 DIAGNOSIS — Z1231 Encounter for screening mammogram for malignant neoplasm of breast: Secondary | ICD-10-CM | POA: Diagnosis not present

## 2021-07-27 DIAGNOSIS — L900 Lichen sclerosus et atrophicus: Secondary | ICD-10-CM | POA: Diagnosis not present

## 2021-08-18 DIAGNOSIS — Z23 Encounter for immunization: Secondary | ICD-10-CM | POA: Diagnosis not present

## 2021-08-18 DIAGNOSIS — Z7984 Long term (current) use of oral hypoglycemic drugs: Secondary | ICD-10-CM | POA: Diagnosis not present

## 2021-08-18 DIAGNOSIS — I10 Essential (primary) hypertension: Secondary | ICD-10-CM | POA: Diagnosis not present

## 2021-08-18 DIAGNOSIS — E7800 Pure hypercholesterolemia, unspecified: Secondary | ICD-10-CM | POA: Diagnosis not present

## 2021-08-18 DIAGNOSIS — F324 Major depressive disorder, single episode, in partial remission: Secondary | ICD-10-CM | POA: Diagnosis not present

## 2021-08-18 DIAGNOSIS — I700 Atherosclerosis of aorta: Secondary | ICD-10-CM | POA: Diagnosis not present

## 2021-08-18 DIAGNOSIS — E1169 Type 2 diabetes mellitus with other specified complication: Secondary | ICD-10-CM | POA: Diagnosis not present

## 2021-08-18 DIAGNOSIS — E663 Overweight: Secondary | ICD-10-CM | POA: Diagnosis not present

## 2021-09-09 ENCOUNTER — Ambulatory Visit
Admission: RE | Admit: 2021-09-09 | Discharge: 2021-09-09 | Disposition: A | Discharge status: Home / Self Care | Payer: Medicare HMO | Source: Ambulatory Visit | Attending: Internal Medicine | Admitting: Internal Medicine

## 2021-09-09 ENCOUNTER — Other Ambulatory Visit: Payer: Self-pay

## 2021-09-09 DIAGNOSIS — M85852 Other specified disorders of bone density and structure, left thigh: Secondary | ICD-10-CM | POA: Diagnosis not present

## 2021-09-09 DIAGNOSIS — M8580 Other specified disorders of bone density and structure, unspecified site: Secondary | ICD-10-CM

## 2021-09-09 DIAGNOSIS — Z780 Asymptomatic menopausal state: Secondary | ICD-10-CM | POA: Diagnosis not present

## 2021-12-07 DIAGNOSIS — E7800 Pure hypercholesterolemia, unspecified: Secondary | ICD-10-CM | POA: Diagnosis not present

## 2021-12-07 DIAGNOSIS — Z0100 Encounter for examination of eyes and vision without abnormal findings: Secondary | ICD-10-CM | POA: Diagnosis not present

## 2021-12-07 DIAGNOSIS — E109 Type 1 diabetes mellitus without complications: Secondary | ICD-10-CM | POA: Diagnosis not present

## 2021-12-07 DIAGNOSIS — H5200 Hypermetropia, unspecified eye: Secondary | ICD-10-CM | POA: Diagnosis not present

## 2022-01-09 DIAGNOSIS — H1089 Other conjunctivitis: Secondary | ICD-10-CM | POA: Diagnosis not present

## 2022-01-09 DIAGNOSIS — J309 Allergic rhinitis, unspecified: Secondary | ICD-10-CM | POA: Diagnosis not present

## 2022-02-25 DIAGNOSIS — Z862 Personal history of diseases of the blood and blood-forming organs and certain disorders involving the immune mechanism: Secondary | ICD-10-CM | POA: Diagnosis not present

## 2022-02-25 DIAGNOSIS — D649 Anemia, unspecified: Secondary | ICD-10-CM | POA: Diagnosis not present

## 2022-02-25 DIAGNOSIS — Z0000 Encounter for general adult medical examination without abnormal findings: Secondary | ICD-10-CM | POA: Diagnosis not present

## 2022-02-25 DIAGNOSIS — I10 Essential (primary) hypertension: Secondary | ICD-10-CM | POA: Diagnosis not present

## 2022-02-25 DIAGNOSIS — E663 Overweight: Secondary | ICD-10-CM | POA: Diagnosis not present

## 2022-02-25 DIAGNOSIS — F324 Major depressive disorder, single episode, in partial remission: Secondary | ICD-10-CM | POA: Diagnosis not present

## 2022-02-25 DIAGNOSIS — I700 Atherosclerosis of aorta: Secondary | ICD-10-CM | POA: Diagnosis not present

## 2022-02-25 DIAGNOSIS — E1169 Type 2 diabetes mellitus with other specified complication: Secondary | ICD-10-CM | POA: Diagnosis not present

## 2022-02-25 DIAGNOSIS — E7800 Pure hypercholesterolemia, unspecified: Secondary | ICD-10-CM | POA: Diagnosis not present

## 2022-03-15 DIAGNOSIS — D649 Anemia, unspecified: Secondary | ICD-10-CM | POA: Diagnosis not present

## 2022-03-31 DIAGNOSIS — D649 Anemia, unspecified: Secondary | ICD-10-CM | POA: Diagnosis not present

## 2022-04-15 DIAGNOSIS — R300 Dysuria: Secondary | ICD-10-CM | POA: Diagnosis not present

## 2022-04-15 DIAGNOSIS — N3001 Acute cystitis with hematuria: Secondary | ICD-10-CM | POA: Diagnosis not present

## 2022-06-02 ENCOUNTER — Other Ambulatory Visit: Payer: Self-pay | Admitting: Internal Medicine

## 2022-06-02 DIAGNOSIS — Z1231 Encounter for screening mammogram for malignant neoplasm of breast: Secondary | ICD-10-CM

## 2022-07-19 ENCOUNTER — Ambulatory Visit
Admission: RE | Admit: 2022-07-19 | Discharge: 2022-07-19 | Disposition: A | Discharge status: Home / Self Care | Payer: Medicare HMO | Source: Ambulatory Visit | Attending: Internal Medicine | Admitting: Internal Medicine

## 2022-07-19 DIAGNOSIS — Z1231 Encounter for screening mammogram for malignant neoplasm of breast: Secondary | ICD-10-CM | POA: Diagnosis not present

## 2022-08-11 DIAGNOSIS — L900 Lichen sclerosus et atrophicus: Secondary | ICD-10-CM | POA: Diagnosis not present

## 2022-08-11 DIAGNOSIS — Z01419 Encounter for gynecological examination (general) (routine) without abnormal findings: Secondary | ICD-10-CM | POA: Diagnosis not present

## 2022-08-31 DIAGNOSIS — I10 Essential (primary) hypertension: Secondary | ICD-10-CM | POA: Diagnosis not present

## 2022-08-31 DIAGNOSIS — E7800 Pure hypercholesterolemia, unspecified: Secondary | ICD-10-CM | POA: Diagnosis not present

## 2022-08-31 DIAGNOSIS — F329 Major depressive disorder, single episode, unspecified: Secondary | ICD-10-CM | POA: Diagnosis not present

## 2022-08-31 DIAGNOSIS — E1169 Type 2 diabetes mellitus with other specified complication: Secondary | ICD-10-CM | POA: Diagnosis not present

## 2023-03-28 DIAGNOSIS — H52223 Regular astigmatism, bilateral: Secondary | ICD-10-CM | POA: Diagnosis not present

## 2023-03-28 DIAGNOSIS — H524 Presbyopia: Secondary | ICD-10-CM | POA: Diagnosis not present

## 2023-03-28 DIAGNOSIS — H259 Unspecified age-related cataract: Secondary | ICD-10-CM | POA: Diagnosis not present

## 2023-03-28 DIAGNOSIS — Z135 Encounter for screening for eye and ear disorders: Secondary | ICD-10-CM | POA: Diagnosis not present

## 2023-03-28 DIAGNOSIS — H5203 Hypermetropia, bilateral: Secondary | ICD-10-CM | POA: Diagnosis not present

## 2023-03-30 DIAGNOSIS — R69 Illness, unspecified: Secondary | ICD-10-CM | POA: Diagnosis not present

## 2023-04-13 DIAGNOSIS — R69 Illness, unspecified: Secondary | ICD-10-CM | POA: Diagnosis not present

## 2023-05-15 DIAGNOSIS — Z23 Encounter for immunization: Secondary | ICD-10-CM | POA: Diagnosis not present

## 2023-05-15 DIAGNOSIS — Z0000 Encounter for general adult medical examination without abnormal findings: Secondary | ICD-10-CM | POA: Diagnosis not present

## 2023-05-15 DIAGNOSIS — E119 Type 2 diabetes mellitus without complications: Secondary | ICD-10-CM | POA: Diagnosis not present

## 2023-05-15 DIAGNOSIS — E7800 Pure hypercholesterolemia, unspecified: Secondary | ICD-10-CM | POA: Diagnosis not present

## 2023-05-15 DIAGNOSIS — E1169 Type 2 diabetes mellitus with other specified complication: Secondary | ICD-10-CM | POA: Diagnosis not present

## 2023-05-15 DIAGNOSIS — F324 Major depressive disorder, single episode, in partial remission: Secondary | ICD-10-CM | POA: Diagnosis not present

## 2023-05-15 DIAGNOSIS — I10 Essential (primary) hypertension: Secondary | ICD-10-CM | POA: Diagnosis not present

## 2023-05-15 DIAGNOSIS — Z5181 Encounter for therapeutic drug level monitoring: Secondary | ICD-10-CM | POA: Diagnosis not present

## 2023-05-15 DIAGNOSIS — I700 Atherosclerosis of aorta: Secondary | ICD-10-CM | POA: Diagnosis not present

## 2023-05-15 DIAGNOSIS — M8588 Other specified disorders of bone density and structure, other site: Secondary | ICD-10-CM | POA: Diagnosis not present

## 2023-06-22 ENCOUNTER — Other Ambulatory Visit: Payer: Self-pay | Admitting: Internal Medicine

## 2023-06-22 DIAGNOSIS — Z1231 Encounter for screening mammogram for malignant neoplasm of breast: Secondary | ICD-10-CM

## 2023-07-25 ENCOUNTER — Ambulatory Visit
Admission: RE | Admit: 2023-07-25 | Discharge: 2023-07-25 | Disposition: A | Discharge status: Home / Self Care | Payer: Medicare HMO | Source: Ambulatory Visit | Attending: Internal Medicine | Admitting: Internal Medicine

## 2023-07-25 DIAGNOSIS — Z1231 Encounter for screening mammogram for malignant neoplasm of breast: Secondary | ICD-10-CM

## 2023-07-26 DIAGNOSIS — N390 Urinary tract infection, site not specified: Secondary | ICD-10-CM | POA: Diagnosis not present

## 2023-08-01 DIAGNOSIS — R69 Illness, unspecified: Secondary | ICD-10-CM | POA: Diagnosis not present

## 2023-08-14 DIAGNOSIS — R69 Illness, unspecified: Secondary | ICD-10-CM | POA: Diagnosis not present

## 2023-08-16 DIAGNOSIS — L900 Lichen sclerosus et atrophicus: Secondary | ICD-10-CM | POA: Diagnosis not present

## 2023-08-16 DIAGNOSIS — I10 Essential (primary) hypertension: Secondary | ICD-10-CM | POA: Diagnosis not present

## 2023-11-17 DIAGNOSIS — E1169 Type 2 diabetes mellitus with other specified complication: Secondary | ICD-10-CM | POA: Diagnosis not present

## 2023-11-17 DIAGNOSIS — I700 Atherosclerosis of aorta: Secondary | ICD-10-CM | POA: Diagnosis not present

## 2023-11-17 DIAGNOSIS — Z23 Encounter for immunization: Secondary | ICD-10-CM | POA: Diagnosis not present

## 2023-11-17 DIAGNOSIS — E7800 Pure hypercholesterolemia, unspecified: Secondary | ICD-10-CM | POA: Diagnosis not present

## 2023-11-17 DIAGNOSIS — I10 Essential (primary) hypertension: Secondary | ICD-10-CM | POA: Diagnosis not present

## 2023-11-17 DIAGNOSIS — F329 Major depressive disorder, single episode, unspecified: Secondary | ICD-10-CM | POA: Diagnosis not present

## 2023-11-17 DIAGNOSIS — E663 Overweight: Secondary | ICD-10-CM | POA: Diagnosis not present

## 2024-07-18 ENCOUNTER — Other Ambulatory Visit (HOSPITAL_BASED_OUTPATIENT_CLINIC_OR_DEPARTMENT_OTHER): Payer: Self-pay | Admitting: Internal Medicine

## 2024-07-18 DIAGNOSIS — E1169 Type 2 diabetes mellitus with other specified complication: Secondary | ICD-10-CM | POA: Diagnosis not present

## 2024-07-18 DIAGNOSIS — E663 Overweight: Secondary | ICD-10-CM | POA: Diagnosis not present

## 2024-07-18 DIAGNOSIS — I700 Atherosclerosis of aorta: Secondary | ICD-10-CM | POA: Diagnosis not present

## 2024-07-18 DIAGNOSIS — Z23 Encounter for immunization: Secondary | ICD-10-CM | POA: Diagnosis not present

## 2024-07-18 DIAGNOSIS — E7800 Pure hypercholesterolemia, unspecified: Secondary | ICD-10-CM | POA: Diagnosis not present

## 2024-07-18 DIAGNOSIS — M8580 Other specified disorders of bone density and structure, unspecified site: Secondary | ICD-10-CM

## 2024-07-18 DIAGNOSIS — I10 Essential (primary) hypertension: Secondary | ICD-10-CM | POA: Diagnosis not present

## 2024-07-18 DIAGNOSIS — F329 Major depressive disorder, single episode, unspecified: Secondary | ICD-10-CM | POA: Diagnosis not present

## 2024-07-18 DIAGNOSIS — Z0000 Encounter for general adult medical examination without abnormal findings: Secondary | ICD-10-CM | POA: Diagnosis not present

## 2024-07-18 DIAGNOSIS — R059 Cough, unspecified: Secondary | ICD-10-CM | POA: Diagnosis not present

## 2024-07-18 DIAGNOSIS — Z5181 Encounter for therapeutic drug level monitoring: Secondary | ICD-10-CM | POA: Diagnosis not present

## 2024-07-31 DIAGNOSIS — R059 Cough, unspecified: Secondary | ICD-10-CM | POA: Diagnosis not present

## 2024-07-31 DIAGNOSIS — E871 Hypo-osmolality and hyponatremia: Secondary | ICD-10-CM | POA: Diagnosis not present

## 2024-08-22 DIAGNOSIS — L900 Lichen sclerosus et atrophicus: Secondary | ICD-10-CM | POA: Diagnosis not present

## 2024-08-22 DIAGNOSIS — Z01419 Encounter for gynecological examination (general) (routine) without abnormal findings: Secondary | ICD-10-CM | POA: Diagnosis not present

## 2024-08-22 DIAGNOSIS — I10 Essential (primary) hypertension: Secondary | ICD-10-CM | POA: Diagnosis not present

## 2024-08-27 DIAGNOSIS — E871 Hypo-osmolality and hyponatremia: Secondary | ICD-10-CM | POA: Diagnosis not present

## 2024-09-11 ENCOUNTER — Other Ambulatory Visit: Payer: Self-pay | Admitting: Internal Medicine

## 2024-09-11 DIAGNOSIS — Z1231 Encounter for screening mammogram for malignant neoplasm of breast: Secondary | ICD-10-CM

## 2024-10-09 ENCOUNTER — Inpatient Hospital Stay: Admission: RE | Admit: 2024-10-09 | Discharge: 2024-10-09 | Attending: Internal Medicine | Admitting: Internal Medicine

## 2024-10-09 DIAGNOSIS — Z1231 Encounter for screening mammogram for malignant neoplasm of breast: Secondary | ICD-10-CM
